# Patient Record
Sex: Female | Born: 1970 | Race: Black or African American | Hispanic: No | Smoking: Current every day smoker
Health system: Southern US, Community
[De-identification: ages and names within clinical notes are randomized; demographics above are authoritative.]

---

## 1997-12-13 ENCOUNTER — Other Ambulatory Visit: Admission: RE | Admit: 1997-12-13 | Discharge: 1997-12-13 | Payer: Self-pay | Admitting: Obstetrics & Gynecology

## 1998-05-10 ENCOUNTER — Inpatient Hospital Stay (HOSPITAL_COMMUNITY): Admission: AD | Admit: 1998-05-10 | Discharge: 1998-05-12 | Payer: Self-pay | Admitting: Obstetrics and Gynecology

## 2001-05-31 ENCOUNTER — Other Ambulatory Visit: Admission: RE | Admit: 2001-05-31 | Discharge: 2001-05-31 | Payer: Self-pay | Admitting: Internal Medicine

## 2003-07-25 ENCOUNTER — Emergency Department (HOSPITAL_COMMUNITY): Admission: EM | Admit: 2003-07-25 | Discharge: 2003-07-25 | Payer: Self-pay | Admitting: Family Medicine

## 2003-11-27 ENCOUNTER — Encounter: Admission: RE | Admit: 2003-11-27 | Discharge: 2003-11-27 | Payer: Self-pay | Admitting: Family Medicine

## 2003-12-28 ENCOUNTER — Encounter: Admission: RE | Admit: 2003-12-28 | Discharge: 2004-01-24 | Payer: Self-pay | Admitting: Orthopedic Surgery

## 2004-05-16 ENCOUNTER — Inpatient Hospital Stay (HOSPITAL_COMMUNITY): Admission: AD | Admit: 2004-05-16 | Discharge: 2004-05-16 | Payer: Self-pay | Admitting: Obstetrics and Gynecology

## 2004-05-19 ENCOUNTER — Inpatient Hospital Stay (HOSPITAL_COMMUNITY): Admission: AD | Admit: 2004-05-19 | Discharge: 2004-05-19 | Payer: Self-pay | Admitting: Obstetrics and Gynecology

## 2004-05-22 ENCOUNTER — Inpatient Hospital Stay (HOSPITAL_COMMUNITY): Admission: AD | Admit: 2004-05-22 | Discharge: 2004-05-22 | Payer: Self-pay | Admitting: Obstetrics and Gynecology

## 2004-06-03 ENCOUNTER — Inpatient Hospital Stay (HOSPITAL_COMMUNITY): Admission: AD | Admit: 2004-06-03 | Discharge: 2004-06-03 | Payer: Self-pay | Admitting: Obstetrics and Gynecology

## 2004-06-10 ENCOUNTER — Inpatient Hospital Stay (HOSPITAL_COMMUNITY): Admission: AD | Admit: 2004-06-10 | Discharge: 2004-06-10 | Payer: Self-pay | Admitting: Obstetrics and Gynecology

## 2004-06-18 ENCOUNTER — Inpatient Hospital Stay (HOSPITAL_COMMUNITY): Admission: AD | Admit: 2004-06-18 | Discharge: 2004-06-18 | Payer: Self-pay | Admitting: Obstetrics and Gynecology

## 2004-06-25 ENCOUNTER — Inpatient Hospital Stay (HOSPITAL_COMMUNITY): Admission: AD | Admit: 2004-06-25 | Discharge: 2004-06-25 | Payer: Self-pay | Admitting: Obstetrics and Gynecology

## 2004-07-09 ENCOUNTER — Inpatient Hospital Stay (HOSPITAL_COMMUNITY): Admission: AD | Admit: 2004-07-09 | Discharge: 2004-07-09 | Payer: Self-pay | Admitting: Obstetrics and Gynecology

## 2004-07-16 ENCOUNTER — Inpatient Hospital Stay (HOSPITAL_COMMUNITY): Admission: AD | Admit: 2004-07-16 | Discharge: 2004-07-16 | Payer: Self-pay | Admitting: Obstetrics and Gynecology

## 2004-07-24 ENCOUNTER — Inpatient Hospital Stay (HOSPITAL_COMMUNITY): Admission: AD | Admit: 2004-07-24 | Discharge: 2004-07-24 | Payer: Self-pay | Admitting: Obstetrics and Gynecology

## 2004-08-07 ENCOUNTER — Inpatient Hospital Stay (HOSPITAL_COMMUNITY): Admission: AD | Admit: 2004-08-07 | Discharge: 2004-08-07 | Payer: Self-pay | Admitting: Obstetrics and Gynecology

## 2010-04-20 ENCOUNTER — Encounter: Payer: Self-pay | Admitting: Family Medicine

## 2010-05-19 ENCOUNTER — Emergency Department (HOSPITAL_COMMUNITY)
Admission: EM | Admit: 2010-05-19 | Discharge: 2010-05-20 | Disposition: A | Discharge status: Home / Self Care | Payer: Medicaid Other | Attending: Emergency Medicine | Admitting: Emergency Medicine

## 2010-05-19 DIAGNOSIS — R51 Headache: Secondary | ICD-10-CM | POA: Insufficient documentation

## 2010-05-19 DIAGNOSIS — K2990 Gastroduodenitis, unspecified, without bleeding: Secondary | ICD-10-CM | POA: Insufficient documentation

## 2010-05-19 DIAGNOSIS — K2970 Gastritis, unspecified, without bleeding: Secondary | ICD-10-CM | POA: Insufficient documentation

## 2010-05-19 DIAGNOSIS — R1013 Epigastric pain: Secondary | ICD-10-CM | POA: Insufficient documentation

## 2010-05-20 LAB — CBC
HCT: 34.4 % — ABNORMAL LOW (ref 36.0–46.0)
MCH: 27.4 pg (ref 26.0–34.0)
MCV: 80.6 fL (ref 78.0–100.0)
RBC: 4.27 MIL/uL (ref 3.87–5.11)
WBC: 7.4 10*3/uL (ref 4.0–10.5)

## 2010-05-20 LAB — COMPREHENSIVE METABOLIC PANEL
CO2: 29 mEq/L (ref 19–32)
Calcium: 9.2 mg/dL (ref 8.4–10.5)
GFR calc Af Amer: >60 mL/min (ref >60)
GFR calc non Af Amer: 60 mL/min — ABNORMAL LOW (ref >60)
Glucose, Bld: 93 mg/dL (ref 70–99)
Sodium: 135 mEq/L (ref 135–145)
Total Bilirubin: 0.3 mg/dL (ref 0.3–1.2)

## 2010-05-20 LAB — POCT PREGNANCY, URINE: Preg Test, Ur: NEGATIVE

## 2010-05-20 LAB — DIFFERENTIAL
Basophils Relative: 1 % (ref 0–1)
Eosinophils Absolute: 0.2 10*3/uL (ref 0.0–0.7)
Eosinophils Relative: 3 % (ref 0–5)
Lymphs Abs: 2.5 10*3/uL (ref 0.7–4.0)

## 2010-09-21 ENCOUNTER — Emergency Department (HOSPITAL_COMMUNITY)
Admission: EM | Admit: 2010-09-21 | Discharge: 2010-09-22 | Disposition: A | Discharge status: Home / Self Care | Payer: Self-pay | Attending: Emergency Medicine | Admitting: Emergency Medicine

## 2010-09-21 DIAGNOSIS — R079 Chest pain, unspecified: Secondary | ICD-10-CM | POA: Insufficient documentation

## 2010-09-21 DIAGNOSIS — K2970 Gastritis, unspecified, without bleeding: Secondary | ICD-10-CM | POA: Insufficient documentation

## 2010-09-21 DIAGNOSIS — K219 Gastro-esophageal reflux disease without esophagitis: Secondary | ICD-10-CM | POA: Insufficient documentation

## 2010-09-21 DIAGNOSIS — R1013 Epigastric pain: Secondary | ICD-10-CM | POA: Insufficient documentation

## 2010-09-21 LAB — COMPREHENSIVE METABOLIC PANEL
ALT: 11 U/L (ref 0–35)
AST: 16 U/L (ref 0–37)
Alkaline Phosphatase: 49 U/L (ref 39–117)
Calcium: 10 mg/dL (ref 8.4–10.5)
Creatinine, Ser: 0.88 mg/dL (ref 0.50–1.10)
GFR calc non Af Amer: >60 mL/min (ref >60)
Sodium: 134 mEq/L — ABNORMAL LOW (ref 135–145)
Total Bilirubin: 0.3 mg/dL (ref 0.3–1.2)

## 2010-09-21 LAB — URINALYSIS, ROUTINE W REFLEX MICROSCOPIC
Bilirubin Urine: NEGATIVE
Glucose, UA: NEGATIVE mg/dL
Hgb urine dipstick: NEGATIVE
Ketones, ur: NEGATIVE mg/dL
Nitrite: NEGATIVE
Specific Gravity, Urine: 1.006 (ref 1.005–1.030)
pH: 6.5 (ref 5.0–8.0)

## 2010-09-21 LAB — DIFFERENTIAL
Basophils Absolute: 0 10*3/uL (ref 0.0–0.1)
Basophils Relative: 0 % (ref 0–1)
Eosinophils Relative: 3 % (ref 0–5)
Lymphocytes Relative: 36 % (ref 12–46)
Monocytes Absolute: 0.7 10*3/uL (ref 0.1–1.0)
Neutro Abs: 3.5 10*3/uL (ref 1.7–7.7)

## 2010-09-21 LAB — CBC: Hemoglobin: 10.6 g/dL — ABNORMAL LOW (ref 12.0–15.0)

## 2013-05-31 ENCOUNTER — Emergency Department (INDEPENDENT_AMBULATORY_CARE_PROVIDER_SITE_OTHER)
Admission: EM | Admit: 2013-05-31 | Discharge: 2013-05-31 | Disposition: A | Discharge status: Home / Self Care | Payer: Self-pay | Source: Home / Self Care | Attending: Family Medicine | Admitting: Family Medicine

## 2013-05-31 ENCOUNTER — Encounter (HOSPITAL_COMMUNITY): Payer: Self-pay | Admitting: Emergency Medicine

## 2013-05-31 DIAGNOSIS — M7712 Lateral epicondylitis, left elbow: Secondary | ICD-10-CM

## 2013-05-31 DIAGNOSIS — M7710 Lateral epicondylitis, unspecified elbow: Secondary | ICD-10-CM

## 2013-05-31 MED ORDER — NAPROXEN 500 MG PO TABS: 500.0000 mg | ORAL_TABLET | Freq: Two times a day (BID) | ORAL | Status: DC

## 2013-05-31 NOTE — Discharge Instructions (Signed)
Tennis Elbow  Your caregiver has diagnosed you with a condition often referred to as "tennis elbow." This results from small tears or soreness (inflammation) at the start (origin) of the extensor muscles of the forearm. Although the condition is often called tennis or golfer's elbow, it is caused by any repetitive action performed by your elbow.  HOME CARE INSTRUCTIONS   If the condition has been short lived, rest may be the only treatment required. Using your opposite hand or arm to perform the task may help. Even changing your grip may help rest the extremity. These may even prevent the condition from recurring.   Longer standing problems, however, will often be relieved faster by:   Using anti-inflammatory agents.   Applying ice packs for 30 minutes at the end of the working day, at bed time, or when activities are finished.   Your caregiver may also have you wear a splint or sling. This will allow the inflamed tendon to heal.  At times, steroid injections aided with a local anesthetic will be required along with splinting for 1 to 2 weeks. Two to three steroid injections will often solve the problem. In some long standing cases, the inflamed tendon does not respond to conservative (non-surgical) therapy. Then surgery may be required to repair it.  MAKE SURE YOU:    Understand these instructions.   Will watch your condition.   Will get help right away if you are not doing well or get worse.  Document Released: 03/16/2005 Document Revised: 06/08/2011 Document Reviewed: 11/02/2007  Exit[employer] Patient Information 2014 [employer].

## 2013-05-31 NOTE — ED Notes (Signed)
Pt  reports  Pain l  Arm      denys  Any  specefic  Injury         Symptoms   X  5  Days        Pt  Reports   Symptoms  Not  releived  By  otc  meds  -  ibuprophen

## 2013-05-31 NOTE — ED Provider Notes (Signed)
CSN: [id number]     Arrival date & time 05/31/13  1223 History   First MD Initiated Contact with Patient 05/31/13 1403     Chief Complaint  Patient presents with  . Extremity Pain   (Consider location/radiation/quality/duration/timing/severity/associated sxs/prior Treatment) HPI Comments: Patient presents with 4-5 days of left elbow discomfort without recent or remote injury. Denies previous events. Has used a few doses of ibuprofen with limited relief. 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 No fever or joint swelling. No changes in strength or sensation of LUE. States area is most uncomfortable with ROM at elbow and if she needs to pick up or lift an object with her left hand.   The history is provided by the patient.    History reviewed. No pertinent past medical history. History reviewed. No pertinent past surgical history. History reviewed. No pertinent family history. History  Substance Use Topics  . Smoking status: Current Every Day Smoker  . Smokeless tobacco: Not on file  . Alcohol Use: Yes   OB History   Grav Para Term Preterm Abortions TAB SAB Ect Mult Living                 Review of Systems  All other systems reviewed and are negative.    Allergies  Review of patient's allergies indicates no known allergies.  Home Medications   Current Outpatient Rx  Name  Route  Sig  Dispense  Refill  . Ibuprofen (MOTRIN PO)   Oral   Take by mouth.         . naproxen (NAPROSYN) 500 MG tablet   Oral   Take 1 tablet (500 mg total) by mouth 2 (two) times daily. For 7 days   20 tablet   0    BP 144/91  Pulse 86  Temp(Src) 99.2 F (37.3 C) (Oral)  Resp 16  SpO2 100%  LMP 05/18/2013 Physical Exam  Nursing note and vitals reviewed. Constitutional: She is oriented to person, place, and time. She appears well-developed and well-nourished. No distress.  HENT:  Head: Normocephalic and atraumatic.  Cardiovascular: Normal rate, regular rhythm and normal heart sounds.    Pulmonary/Chest: Effort normal and breath sounds normal.  Musculoskeletal: Normal range of motion.       Left elbow: She exhibits normal range of motion, no swelling, no effusion, no deformity and no laceration. Tenderness found. Lateral epicondyle tenderness noted. No radial head, no medial epicondyle and no olecranon process tenderness noted.  CSM exam of LUE intact.   Neurological: She is alert and oriented to person, place, and time.  Skin: Skin is warm and dry. No rash noted.  Psychiatric: She has a normal mood and affect. Her behavior is normal.    ED Course  Procedures (including critical care time) Labs Review Labs Reviewed - No data to display Imaging Review No results found.   MDM   1. Lateral epicondylitis of left elbow    Lateral epicondylitis (Left): Patient provided stretching exercises by Dr. Denyse Amassorey and advised use of ice as needed and NSAIDs as prescribed with [facility] follow up if no improvement.    Jess BartersJennifer Lee EdgewoodPresson, GeorgiaPA 05/31/13 [phone]

## 2013-06-01 NOTE — ED Provider Notes (Signed)
Medical screening examination/treatment/procedure(s) were performed by a resident physician or non-physician practitioner and as the supervising physician I was immediately available for consultation/collaboration.  Clementeen GrahamEvan Aminta Sakurai, MD    Rodolph BongEvan S Adrie Picking, MD 06/01/13 [phone]

## 2015-06-26 ENCOUNTER — Other Ambulatory Visit: Payer: Self-pay | Admitting: Obstetrics and Gynecology

## 2015-06-26 DIAGNOSIS — E049 Nontoxic goiter, unspecified: Secondary | ICD-10-CM

## 2015-07-02 ENCOUNTER — Ambulatory Visit
Admission: RE | Admit: 2015-07-02 | Discharge: 2015-07-02 | Disposition: A | Discharge status: Home / Self Care | Payer: Commercial Managed Care - PPO | Source: Ambulatory Visit | Attending: Obstetrics and Gynecology | Admitting: Obstetrics and Gynecology

## 2015-07-02 DIAGNOSIS — E049 Nontoxic goiter, unspecified: Secondary | ICD-10-CM

## 2015-07-03 ENCOUNTER — Other Ambulatory Visit: Payer: Self-pay | Admitting: Obstetrics and Gynecology

## 2015-07-03 DIAGNOSIS — N63 Unspecified lump in unspecified breast: Secondary | ICD-10-CM

## 2015-07-10 ENCOUNTER — Other Ambulatory Visit: Payer: Commercial Managed Care - PPO

## 2015-07-16 ENCOUNTER — Ambulatory Visit
Admission: RE | Admit: 2015-07-16 | Discharge: 2015-07-16 | Disposition: A | Discharge status: Home / Self Care | Payer: Commercial Managed Care - PPO | Source: Ambulatory Visit | Attending: Obstetrics and Gynecology | Admitting: Obstetrics and Gynecology

## 2015-07-16 DIAGNOSIS — N63 Unspecified lump in unspecified breast: Secondary | ICD-10-CM

## 2015-10-29 ENCOUNTER — Other Ambulatory Visit: Payer: Self-pay | Admitting: Endocrinology

## 2015-10-29 DIAGNOSIS — E049 Nontoxic goiter, unspecified: Secondary | ICD-10-CM

## 2016-01-02 ENCOUNTER — Encounter (HOSPITAL_COMMUNITY): Payer: Self-pay | Admitting: Emergency Medicine

## 2016-01-02 ENCOUNTER — Ambulatory Visit (HOSPITAL_COMMUNITY)
Admission: EM | Admit: 2016-01-02 | Discharge: 2016-01-02 | Disposition: A | Discharge status: Home / Self Care | Payer: Commercial Managed Care - PPO | Attending: Internal Medicine | Admitting: Internal Medicine

## 2016-01-02 DIAGNOSIS — H0015 Chalazion left lower eyelid: Secondary | ICD-10-CM

## 2016-01-02 MED ORDER — DOXYCYCLINE HYCLATE 100 MG PO CAPS: 100.0000 mg | ORAL_CAPSULE | Freq: Two times a day (BID) | ORAL | 0 refills | Status: DC

## 2016-01-02 NOTE — ED Provider Notes (Signed)
  [facility]    CSN: [id number] Arrival date & time: 01/02/16  1642     History   Chief Complaint Chief Complaint  Patient presents with  . Stye    HPI Vanessa Wade is a 45 y.o. female. She presents this evening with 2 weeks history of smooth swelling of the left lower lid, nonpainful. Not draining. Has been applying warm compresses without resolution. No change in vision, no eye drainage. Feels fine otherwise  HPI  History reviewed. No pertinent past medical history.  History reviewed. No pertinent surgical history.    Home Medications    Prior to Admission medications   Medication Sig Start Date End Date Taking? Authorizing Provider  doxycycline (VIBRAMYCIN) 100 MG capsule Take 1 capsule (100 mg total) by mouth 2 (two) times daily. 01/02/16   Eustace MooreLaura W Laquanta Hummel, MD  Ibuprofen (MOTRIN PO) Take by mouth.    Historical Provider, MD  naproxen (NAPROSYN) 500 MG tablet Take 1 tablet (500 mg total) by mouth 2 (two) times daily. For 7 days 05/31/13   Ria ClockJennifer Lee H Presson, PA    Family History History reviewed. No pertinent family history.  Social History Social History  Substance Use Topics  . Smoking status: Current Every Day Smoker  . Smokeless tobacco: Never Used  . Alcohol use Yes     Allergies   Review of patient's allergies indicates no known allergies.   Review of Systems Review of Systems  All other systems reviewed and are negative.    Physical Exam Triage Vital Signs ED Triage Vitals [01/02/16 1751]  Enc Vitals Group     BP 138/77     Pulse Rate 97     Resp 12     Temp 98.8 F (37.1 C)     Temp Source Oral     SpO2 100 %     Weight      Height    Updated Vital Signs BP 138/77 (BP Location: Left Arm)   Pulse 97   Temp 98.8 F (37.1 C) (Oral)   Resp 12   SpO2 100%  Physical Exam  Constitutional: She is oriented to person, place, and time. No distress.  Alert, nicely groomed  HENT:  Head: Atraumatic.  Eyes:  Conjugate gaze,  no eye redness/drainage 6 mm smooth yellowish nontender cyst just below the lashes in the left lower eyelid. Not pointing. Not red.  Neck: Neck supple.  Cardiovascular: Normal rate.   Pulmonary/Chest: No respiratory distress.  Abdominal: She exhibits no distension.  Musculoskeletal: Normal range of motion.  No leg swelling  Neurological: She is alert and oriented to person, place, and time.  Skin: Skin is warm and dry.  No cyanosis  Nursing note and vitals reviewed.    UC Treatments / Results   Procedures Procedures (including critical care time) None today  Final Clinical Impressions(s) / UC Diagnoses   Final diagnoses:  Chalazion left lower eyelid   Warm compresses; can try doxycycline prescription.  Followup with eye doctor for further evaluation if symptoms persist.    New Prescriptions New Prescriptions   DOXYCYCLINE (VIBRAMYCIN) 100 MG CAPSULE    Take 1 capsule (100 mg total) by mouth 2 (two) times daily.     Eustace MooreLaura W Barbarajean Kinzler, MD 01/02/16 [phone]

## 2016-01-02 NOTE — Discharge Instructions (Addendum)
Warm compresses; can try doxycycline prescription.  Followup with eye doctor for further evaluation if symptoms persist.

## 2016-01-02 NOTE — ED Triage Notes (Signed)
Pt here for sty to lower left eye onset 2 weeks... Denies pain  Has been applying warm compression w/no relief  A&O x4... NAD

## 2016-01-02 NOTE — ED Notes (Signed)
D/c by Dr. Murray 

## 2019-08-16 ENCOUNTER — Encounter (INDEPENDENT_AMBULATORY_CARE_PROVIDER_SITE_OTHER): Payer: Self-pay | Admitting: Primary Care

## 2019-08-16 ENCOUNTER — Other Ambulatory Visit: Payer: Self-pay

## 2019-08-16 ENCOUNTER — Ambulatory Visit (INDEPENDENT_AMBULATORY_CARE_PROVIDER_SITE_OTHER): Payer: PRIVATE HEALTH INSURANCE | Admitting: Primary Care

## 2019-08-16 VITALS — BP 137/85 | HR 81 | Temp 97.5°F | Ht 64.0 in | Wt 187.4 lb

## 2019-08-16 DIAGNOSIS — Z7689 Persons encountering health services in other specified circumstances: Secondary | ICD-10-CM

## 2019-08-16 DIAGNOSIS — Z13220 Encounter for screening for lipoid disorders: Secondary | ICD-10-CM

## 2019-08-16 DIAGNOSIS — Z131 Encounter for screening for diabetes mellitus: Secondary | ICD-10-CM | POA: Diagnosis not present

## 2019-08-16 DIAGNOSIS — G4733 Obstructive sleep apnea (adult) (pediatric): Secondary | ICD-10-CM | POA: Diagnosis not present

## 2019-08-16 DIAGNOSIS — R030 Elevated blood-pressure reading, without diagnosis of hypertension: Secondary | ICD-10-CM | POA: Diagnosis not present

## 2019-08-16 DIAGNOSIS — Z6832 Body mass index (BMI) 32.0-32.9, adult: Secondary | ICD-10-CM

## 2019-08-16 DIAGNOSIS — E6609 Other obesity due to excess calories: Secondary | ICD-10-CM

## 2019-08-16 LAB — POCT GLYCOSYLATED HEMOGLOBIN (HGB A1C): Hemoglobin A1C: 5.1 % (ref 4.0–5.6)

## 2019-08-16 NOTE — Progress Notes (Signed)
.men   New Patient Office Visit  Subjective:  Patient ID: Vanessa Wade, female    DOB: 11-15-1970  Age: 49 y.o. MRN: [id number]  CC:  Chief Complaint  Patient presents with  . New Patient (Initial Visit)    HPI Vanessa Wade is a 49 year old [place] American female who presents for establishment of care. She is concerned about elevated blood pressure reading despite diet modification monitoring her salt intake and exercising. Denies shortness of breath, headaches, chest pain or lower extremity edema, sudden onset, vision changes, unilateral weakness, dizziness, paresthesias. Today her Bp is slightly elevated but only have this 1 reading.   History reviewed. No pertinent past medical history.  History reviewed. No pertinent surgical history.  History reviewed. No pertinent family history.  Social History   Socioeconomic History  . Marital status: Divorced    Spouse name: Not on file  . Number of children: Not on file  . Years of education: Not on file  . Highest education level: Not on file  Occupational History  . Not on file  Tobacco Use  . Smoking status: Current Every Day Smoker  . Smokeless tobacco: Never Used  Substance and Sexual Activity  . Alcohol use: Yes  . Drug use: Not on file  . Sexual activity: Not on file  Other Topics Concern  . Not on file  Social History Narrative  . Not on file   Social Determinants of Health   Financial Resource Strain:   . Difficulty of Paying Living Expenses:   Food Insecurity:   . Worried About Charity fundraiser in the Last Year:   . Arboriculturist in the Last Year:   Transportation Needs:   . Film/video editor (Medical):   Marland Kitchen Lack of Transportation (Non-Medical):   Physical Activity:   . Days of Exercise per Week:   . Minutes of Exercise per Session:   Stress:   . Feeling of Stress :   Social Connections:   . Frequency of Communication with Friends and Family:   . Frequency of Social Gatherings with  Friends and Family:   . Attends Religious Services:   . Active Member of Clubs or Organizations:   . Attends Archivist Meetings:   Marland Kitchen Marital Status:   Intimate Partner Violence:   . Fear of Current or Ex-Partner:   . Emotionally Abused:   Marland Kitchen Physically Abused:   . Sexually Abused:     ROS Review of Systems  All other systems reviewed and are negative.   Objective:   Today's Vitals: BP 137/85 (BP Location: Right Arm, Patient Position: Sitting, Cuff Size: Large)   Pulse 81   Temp (!) 97.5 F (36.4 C) (Temporal)   Ht 5\' 4"  (1.626 m)   Wt 187 lb 6.4 oz (85 kg)   LMP 08/12/2019 (Exact Date)   SpO2 100%   BMI 32.17 kg/m   Physical Exam CONSTITUTIONAL: Well-developed, well-nourished obese female in no acute distress.  HENT:  Normocephalic, atraumatic, External right and left ear normal. Oropharynx is clear and moist EYES: Conjunctivae and EOM are normal. Pupils are equal, round, and reactive to light. No scleral icterus.  NECK: Normal range of motion, supple, no masses.  Normal thyroid.  SKIN: Skin is warm and dry. No rash noted. Not diaphoretic. No erythema. No pallor. [place]: Alert and oriented to person, place, and time. Normal reflexes, muscle tone coordination. No cranial nerve deficit noted. PSYCHIATRIC: Normal mood and affect.  Normal behavior. Normal judgment and thought content. CARDIOVASCULAR: Normal heart rate noted, regular rhythm RESPIRATORY: Clear to auscultation bilaterally. Effort and breath sounds normal, no problems with respiration noted. ABDOMEN: Soft, normal bowel sounds, no distention noted.  No tenderness, rebound or guarding.  MUSCULOSKELETAL: Normal range of motion. No tenderness.  No cyanosis, clubbing, or edema.  2+ distal pulses. Assessment & Plan:  Lalanya was seen today for new patient (initial visit).  Diagnoses and all orders for this visit:  Encounter to establish care Gwinda Passe, NP-C will be your  (PCP) she is mastered prepared  . She is skilled to diagnosed and treat illness. Also able to answer health concern as well as continuing care of varied medical conditions, not limited by cause, organ system, or diagnosis.   Screening for diabetes mellitus -     HgB A1c 5.1  OSA (obstructive sleep apnea) Patient presents with possible obstructive sleep apnea. Patent has a 3 years history of symptoms of none. Patient generally gets 6 or 7 hours of sleep per night, and states they generally have nightime awakenings. Snoring of moderate severity is present. Apneic episodes is not present. Nasal obstruction is not present.  Patient has had tonsillectomy.   Elevated blood pressure reading without diagnosis of hypertension Counseled on blood pressure goal of less than 130/80, low-sodium, DASH diet, medication compliance, 150 minutes of moderate intensity exercise per week.  Class 1 obesity due to excess calories without serious comorbidity with body mass index (BMI) of 32.0 to 32.9 in adult Obesity is 30-39 indicating an excess in caloric intake or underlining conditions. This may lead to other co-morbidities examples , HTN, diabetes and respiratory disease. Lifestyle modifications of diet and exercise may reduce obesity.     Outpatient Encounter Medications as of 08/16/2019  Medication Sig  . [DISCONTINUED] doxycycline (VIBRAMYCIN) 100 MG capsule Take 1 capsule (100 mg total) by mouth 2 (two) times daily.  . [DISCONTINUED] Ibuprofen (MOTRIN PO) Take by mouth.   No facility-administered encounter medications on file as of 08/16/2019.    Follow-up: Return for schedule pap.   Grayce Sessions, NP

## 2019-08-17 LAB — LIPID PANEL
Chol/HDL Ratio: 2.8 ratio (ref 0.0–4.4)
Cholesterol, Total: 196 mg/dL (ref 100–199)
HDL: 69 mg/dL (ref >39)
LDL Chol Calc (NIH): 114 mg/dL — ABNORMAL HIGH (ref 0–99)
Triglycerides: 71 mg/dL (ref 0–149)
VLDL Cholesterol Cal: 13 mg/dL (ref 5–40)

## 2019-08-17 LAB — TSH+FREE T4
Free T4: 1.17 ng/dL (ref 0.82–1.77)
TSH: 0.632 u[IU]/mL (ref 0.450–4.500)

## 2019-08-22 ENCOUNTER — Telehealth (INDEPENDENT_AMBULATORY_CARE_PROVIDER_SITE_OTHER): Payer: Self-pay

## 2019-08-22 NOTE — Telephone Encounter (Signed)
Patient verified date of birth. She is aware that labs are normal except elevated LDL. Which can lead to stroke and heart attack. Advised patient on decreasing fatty food,red meat, cheese and milk; increase fiber with whole grains and veggies. If diet modifications do not lower cholesterol medication will be added at recheck. She verbalized understanding. Maryjean Morn, CMA

## 2019-08-22 NOTE — Telephone Encounter (Signed)
-----   Message from Grayce Sessions, NP sent at 08/21/2019  1:55 PM EDT ----- Labs are normal except LDL elevated. Your LDL is not in range. Your LDL is the bad cholesterol that can lead to heart attack and stroke. To lower your number you can decrease your fatty foods, red meat, cheese, milk and increase fiber like whole grains and veggies. You can also add a fiber supplement like Metamucil or Benefiber. If this is not done with diet modification and fiber supplements follow up labs will need to add a cholesterol medication

## 2019-08-30 ENCOUNTER — Other Ambulatory Visit (HOSPITAL_COMMUNITY)
Admission: RE | Admit: 2019-08-30 | Discharge: 2019-08-30 | Disposition: A | Discharge status: Home / Self Care | Payer: PRIVATE HEALTH INSURANCE | Source: Ambulatory Visit | Attending: Primary Care | Admitting: Primary Care

## 2019-08-30 ENCOUNTER — Other Ambulatory Visit: Payer: Self-pay

## 2019-08-30 ENCOUNTER — Ambulatory Visit (INDEPENDENT_AMBULATORY_CARE_PROVIDER_SITE_OTHER): Payer: PRIVATE HEALTH INSURANCE | Admitting: Primary Care

## 2019-08-30 ENCOUNTER — Encounter (INDEPENDENT_AMBULATORY_CARE_PROVIDER_SITE_OTHER): Payer: Self-pay | Admitting: Primary Care

## 2019-08-30 VITALS — BP 113/76 | HR 89 | Temp 98.6°F | Ht 64.0 in | Wt 184.8 lb

## 2019-08-30 DIAGNOSIS — N898 Other specified noninflammatory disorders of vagina: Secondary | ICD-10-CM | POA: Insufficient documentation

## 2019-08-30 DIAGNOSIS — Z124 Encounter for screening for malignant neoplasm of cervix: Secondary | ICD-10-CM

## 2019-08-30 DIAGNOSIS — Z1231 Encounter for screening mammogram for malignant neoplasm of breast: Secondary | ICD-10-CM | POA: Diagnosis not present

## 2019-08-30 DIAGNOSIS — Z01419 Encounter for gynecological examination (general) (routine) without abnormal findings: Secondary | ICD-10-CM

## 2019-08-30 DIAGNOSIS — Z114 Encounter for screening for human immunodeficiency virus [HIV]: Secondary | ICD-10-CM

## 2019-08-30 NOTE — Patient Instructions (Signed)

## 2019-08-30 NOTE — Progress Notes (Signed)
Subjective:     Vanessa Wade is a 49 y.o. female and is here for a comprehensive physical exam. The patient reports no problems.  Social History   Socioeconomic History  . Marital status: Divorced    Spouse name: Not on file  . Number of children: Not on file  . Years of education: Not on file  . Highest education level: Not on file  Occupational History  . Not on file  Tobacco Use  . Smoking status: Current Every Day Smoker  . Smokeless tobacco: Never Used  Substance and Sexual Activity  . Alcohol use: Yes  . Drug use: Not on file  . Sexual activity: Not on file  Other Topics Concern  . Not on file  Social History Narrative  . Not on file   Social Determinants of Health   Financial Resource Strain:   . Difficulty of Paying Living Expenses:   Food Insecurity:   . Worried About Charity fundraiser in the Last Year:   . Arboriculturist in the Last Year:   Transportation Needs:   . Film/video editor (Medical):   Marland Kitchen Lack of Transportation (Non-Medical):   Physical Activity:   . Days of Exercise per Week:   . Minutes of Exercise per Session:   Stress:   . Feeling of Stress :   Social Connections:   . Frequency of Communication with Friends and Family:   . Frequency of Social Gatherings with Friends and Family:   . Attends Religious Services:   . Active Member of Clubs or Organizations:   . Attends Archivist Meetings:   Marland Kitchen Marital Status:   Intimate Partner Violence:   . Fear of Current or Ex-Partner:   . Emotionally Abused:   Marland Kitchen Physically Abused:   . Sexually Abused:    Health Maintenance  Topic Date Due  . COVID-19 Vaccine (1) Never done  . HIV Screening  Never done  . PAP SMEAR-Modifier  Never done  . TETANUS/TDAP  11/12/2019 (Originally 02/09/1991)  . INFLUENZA VACCINE  10/29/2019      Review of Systems A comprehensive review of systems was negative.   Objective:     BP 113/76 (BP Location: Right Arm, Patient Position: Sitting,  Cuff Size: Large)   Pulse 89   Temp 98.6 F (37 C) (Tympanic)   Ht 5\' 4"  (1.626 m)   Wt 184 lb 12.8 oz (83.8 kg)   LMP 08/12/2019 (Exact Date)   SpO2 97%   BMI 31.72 kg/m   Assessment:    Healthy female exam. CONSTITUTIONAL: Well-developed, well-nourished female in no acute distress.  HENT:  Normocephalic, atraumatic, External right and left ear normal. Oropharynx is clear and moist EYES: Conjunctivae and EOM are normal. Pupils are equal, round, and reactive to light. No scleral icterus.  NECK: Normal range of motion, supple, no masses.  Normal thyroid.  SKIN: Skin is warm and dry. No rash noted. Not diaphoretic. No erythema. No pallor. [place]: Alert and oriented to person, place, and time. Normal reflexes, muscle tone coordination. No cranial nerve deficit noted. PSYCHIATRIC: Normal mood and affect. Normal behavior. Normal judgment and thought content. CARDIOVASCULAR: Normal heart rate noted, regular rhythm RESPIRATORY: Clear to auscultation bilaterally. Effort and breath sounds normal, no problems with respiration noted. BREASTS:Taught SBE and refer for mammogram . ABDOMEN: Soft, normal bowel sounds, no distention noted.  No tenderness, rebound or guarding.  PELVIC: Normal appearing external genitalia; normal appearing vaginal mucosa and cervix.  No  abnormal discharge noted.  Pap smear obtained.  Normal uterine size, no other palpable masses, no uterine or adnexal tenderness. MUSCULOSKELETAL: Normal range of motion. No tenderness.  No cyanosis, clubbing, or edema.  2+ distal pulses.  Plan:  Jace was seen today for gynecologic exam.  Diagnoses and all orders for this visit:  Encounter for gynecological examination without abnormal finding Normal   Encounter for screening mammogram for malignant neoplasm of breast -     MM Digital Diagnostic Bilat; Future  Cervical cancer screening -     Cytology - PAP([facility])  Vaginal discharge -     Cervicovaginal ancillary  only  Screening for HIV (human immunodeficiency virus) -     HIV antibody (with reflex)     See After Visit Summary for Counseling Recommendations

## 2019-08-31 ENCOUNTER — Other Ambulatory Visit (INDEPENDENT_AMBULATORY_CARE_PROVIDER_SITE_OTHER): Payer: Self-pay | Admitting: Primary Care

## 2019-08-31 DIAGNOSIS — B379 Candidiasis, unspecified: Secondary | ICD-10-CM

## 2019-08-31 LAB — HIV ANTIBODY (ROUTINE TESTING W REFLEX): HIV Screen 4th Generation wRfx: NONREACTIVE

## 2019-08-31 LAB — CERVICOVAGINAL ANCILLARY ONLY
Bacterial Vaginitis (gardnerella): NEGATIVE
Candida Glabrata: NEGATIVE
Candida Vaginitis: POSITIVE — AB
Chlamydia: NEGATIVE
Comment: NEGATIVE
Comment: NEGATIVE
Comment: NEGATIVE
Comment: NEGATIVE
Comment: NEGATIVE
Comment: NORMAL
Neisseria Gonorrhea: NEGATIVE
Trichomonas: NEGATIVE

## 2019-08-31 LAB — CYTOLOGY - PAP: Diagnosis: NEGATIVE

## 2019-08-31 MED ORDER — FLUCONAZOLE 150 MG PO TABS: 150.0000 mg | ORAL_TABLET | Freq: Once | ORAL | 0 refills | Status: AC

## 2019-09-01 ENCOUNTER — Other Ambulatory Visit (INDEPENDENT_AMBULATORY_CARE_PROVIDER_SITE_OTHER): Payer: Self-pay | Admitting: Primary Care

## 2019-09-01 MED ORDER — FLUCONAZOLE 150 MG PO TABS
150.0000 mg | ORAL_TABLET | Freq: Once | ORAL | 0 refills | Status: AC
Start: 2019-09-01 — End: 2019-09-01

## 2019-09-13 ENCOUNTER — Other Ambulatory Visit: Payer: Self-pay

## 2019-09-13 ENCOUNTER — Ambulatory Visit
Admission: RE | Admit: 2019-09-13 | Discharge: 2019-09-13 | Disposition: A | Discharge status: Home / Self Care | Payer: PRIVATE HEALTH INSURANCE | Source: Ambulatory Visit | Attending: Primary Care | Admitting: Primary Care

## 2019-09-13 DIAGNOSIS — Z1231 Encounter for screening mammogram for malignant neoplasm of breast: Secondary | ICD-10-CM

## 2020-10-29 ENCOUNTER — Encounter (INDEPENDENT_AMBULATORY_CARE_PROVIDER_SITE_OTHER): Payer: PRIVATE HEALTH INSURANCE | Admitting: Primary Care

## 2020-11-19 ENCOUNTER — Other Ambulatory Visit: Payer: Self-pay

## 2020-11-19 ENCOUNTER — Ambulatory Visit (INDEPENDENT_AMBULATORY_CARE_PROVIDER_SITE_OTHER): Payer: PRIVATE HEALTH INSURANCE | Admitting: Primary Care

## 2020-11-19 ENCOUNTER — Encounter (INDEPENDENT_AMBULATORY_CARE_PROVIDER_SITE_OTHER): Payer: Self-pay | Admitting: Primary Care

## 2020-11-19 VITALS — BP 129/83 | HR 87 | Temp 97.5°F | Ht 64.0 in | Wt 187.2 lb

## 2020-11-19 DIAGNOSIS — N926 Irregular menstruation, unspecified: Secondary | ICD-10-CM | POA: Diagnosis not present

## 2020-11-19 DIAGNOSIS — E049 Nontoxic goiter, unspecified: Secondary | ICD-10-CM

## 2020-11-19 DIAGNOSIS — F17290 Nicotine dependence, other tobacco product, uncomplicated: Secondary | ICD-10-CM | POA: Diagnosis not present

## 2020-11-19 DIAGNOSIS — E6609 Other obesity due to excess calories: Secondary | ICD-10-CM | POA: Diagnosis not present

## 2020-11-19 DIAGNOSIS — Z131 Encounter for screening for diabetes mellitus: Secondary | ICD-10-CM

## 2020-11-19 DIAGNOSIS — Z0000 Encounter for general adult medical examination without abnormal findings: Secondary | ICD-10-CM

## 2020-11-19 DIAGNOSIS — Z6832 Body mass index (BMI) 32.0-32.9, adult: Secondary | ICD-10-CM

## 2020-11-19 DIAGNOSIS — Z23 Encounter for immunization: Secondary | ICD-10-CM

## 2020-11-19 DIAGNOSIS — Z1211 Encounter for screening for malignant neoplasm of colon: Secondary | ICD-10-CM

## 2020-11-19 LAB — POCT GLYCOSYLATED HEMOGLOBIN (HGB A1C): Hemoglobin A1C: 5.1 % (ref 4.0–5.6)

## 2020-11-19 NOTE — Progress Notes (Signed)
[facility]  Patient ID: Vanessa Wade, female    DOB: 04-17-1970  MRN: [id number]   Annual Wellness Visit  Vanessa Wade is a 50 y.o. Female who presents for an Annual Wellness Visit. ? There are no problems to display for this patient.   No current outpatient medications on file prior to visit.   No current facility-administered medications on file prior to visit.    No Known Allergies   Health Risk Assessment The patient has completed a Health Risk Assessment. This has been reviewed with the patient and has been scanned into the [facility] system as a separate document.   Current Medical Providers and Suppliers The providers who are involved in the care of this patient are listed above. Additional providers and suppliers are listed below:    Age-appropriate Screening Schedule Refer to the list in the Health Maintenance section for an age appropriated screening completed by this patient. Additional screening recommendations are listed below in the plan section. The patient has been provided with a written plan.    Health Maintenance Due  Topic Date Due   Pneumococcal Vaccine 36-68 Years old (1 - PCV) Never done   Hepatitis C Screening  Never done   COLONOSCOPY (Pts 45-32yrs Insurance coverage will need to be confirmed)  Never done   INFLUENZA VACCINE  10/28/2020    Depression Screen Over the past two weeks have you:     Felt down or depressed? no     Had little interest or pleasure in doing things? no     depression             Hearing Evaluation:     Do you have trouble hearing the television when others do not? no     Do you have to strain to hear/understand conversations? no  Advanced Care Planning     Patient has executed an Advance Directive: no     If no, patient was given the opportunity to execute an Advance Directive today? yes     This patient has the ability to prepare an Advance Directive: no     Provider is willing to follow the patient's  wishes: none made      Cognitive Assessment: Does the patient have evidence of cognitive impairment? No The patient does not have evidence of a change in mood/affect, appearance, speech, memory or motor skills.   I  ? PHYSICAL EXAM: Vitals:   11/19/20 1034  BP: 129/83  Pulse: 87  Temp: (!) 97.5 F (36.4 C)  TempSrc: Temporal  SpO2: 100%  Weight: 187 lb 3.2 oz (84.9 kg)  Height: $Remove'5\' 4"'cMvdkOX$  (1.626 m)   Body mass index is 32.13 kg/m. General appearance - alert, well appearing, and in no distress Mental status - alert, oriented to person, place, and time Eyes - pupils equal and reactive, extraocular eye movements intact Ears - bilateral cerumen  Nose - normal and patent, no erythema, discharge or polyps Neck - supple, goiter  Lymphatics - no palpable lymphadenopathy, no hepatosplenomegaly Chest - clear to auscultation, no wheezes, rales or rhonchi, symmetric air entry Heart - normal rate, regular rhythm, normal S1, S2, no murmurs, rubs, clicks or gallops Abdomen - soft, nontender, nondistended, no masses or organomegaly Back exam - full range of motion, no tenderness, palpable spasm or pain on motion Neurological - alert, oriented, normal speech, no focal findings or movement disorder noted  During the course of the visit the patient was educated and counseled about appropriate screening and  preventive services including:            Influenza vaccine Td vaccine Colorectal cancer screening Nutrition counseling  Discussed as f/u and health maintenance         Discussed the patient's BMI with her. The BMI BMI is not in the acceptable range; no BMI management plan is appropriate.   Orders placed during this encounter include: Orders Placed This Encounter  Procedures   Tdap vaccine greater than or equal to 7yo IM   CBC with Differential   Lipid Panel   CMP14+EGFR   TSH + free T4   Ambulatory referral to Gastroenterology    Referral Priority:   Routine    Referral Type:    Consultation    Referral Reason:   Specialty Services Required    Number of Visits Requested:   1   HgB A1c  Vanessa Wade was seen today for annual exam.  Diagnoses and all orders for this visit:  Screening for diabetes mellitus -     HgB A1c 5.1 per ADA guideline she is not a diabetic   Need for Tdap vaccination -     Tdap vaccine greater than or equal to 7yo IM  Class 1 obesity due to excess calories without serious comorbidity with body mass index (BMI) of 32.0 to 32.9 in adult Obesity is 30-39 indicating an excess in caloric intake or underlining conditions. This may lead to other co-morbidities. Lifestyle modifications of diet and exercise may reduce obesity.    Annual physical exam Completed   Menstrual cycle problem CBC with diff  Cigar smoker motivated to quit  - I have recommended complete cessation of tobacco use. I have discussed various options available for assistance with tobacco cessation including over the counter methods (Nicotine gum, patch and lozenges). We also discussed prescription options (Chantix, Nicotine Inhaler / Nasal Spray). The patient is not interested in pursuing any prescription tobacco cessation options at this time. - Patient declines at this time.  -   Return in about 1 year (around 11/19/2021), or if symptoms worsen or fail to improve/ depends on labs.   ? An after visit summary with all of these plans was given to the patient. Kerin Perna

## 2020-11-19 NOTE — Patient Instructions (Signed)
https://www.cancer.org/cancer/colon-rectal-cancer/causes-risks-prevention/risk-factors.html">  Colorectal Cancer Screening  Colorectal cancer screening is a group of tests that are used to check for colorectal cancer before symptoms develop. Colorectal refers to the colon and rectum. The colon and rectum are located at the end of the digestive tract and carry stool (feces) out of the body. Who should have screening? All adults who are 45-49 years old should have screening. Your health care provider may recommend screening before age 45. You will have tests every 1-10 years, depending on your results and the type of screening test. Screening recommendations for adults who are 76-85 years old vary depending on a person's health. People older than age 85 should no longer get colorectal cancerscreening. You may have screening tests starting before age 45, or more often than other people, if you have any of these risk factors: A personal or family history of colorectal cancer or abnormal growths known as polyps in your colon. Inflammatory bowel disease, such as ulcerative colitis or Crohn's disease. A history of having radiation treatment to the abdomen or the area between the hip bones (pelvic area) for cancer. A type of genetic syndrome that is passed from parent to child (hereditary), such as: Lynch syndrome. Familial adenomatous polyposis. Turcot syndrome. Peutz-Jeghers syndrome. MUTYH-associated polyposis (MAP). A personal history of diabetes. Types of tests There are several types of colorectal screening tests. You may have one or more of the following: Guaiac-based fecal occult blood testing. For this test, a stool sample is checked for hidden (occult) blood, which could be a sign of colorectal cancer. Fecal immunochemical test (FIT). For this test, a stool sample is checked for blood, which could be a sign of colorectal cancer. Stool DNA test. For this test, a stool sample is checked for  blood and changes in DNA that could lead to colorectal cancer. Sigmoidoscopy. During this test, a thin, flexible tube with a camera on the end, called a sigmoidoscope, is used to examine the rectum and the lower colon. Colonoscopy. During this test, a long, flexible tube with a camera on the end, called a colonoscope, is used to examine the entire colon and rectum. Also, sometimes a tissue sample is taken to be looked at under a microscope (biopsy) or small polyps are removed during this test. Virtual colonoscopy. Instead of a colonoscope, this type of colonoscopy uses a CT scan to take pictures of the colon and rectum. A CT scan is a type of X-ray that is made using computers. What are the benefits of screening? Screening reduces your risk for colorectal cancer and can help identify cancer at an early stage, when the cancer can be removed or treated more easily. It is common for polyps to form in the lining of the colon, especially as you age. These polyps may be cancerous or become cancerous over time. Screening canidentify these polyps. What are the risks of screening? Generally, these are safe tests. However, problems may occur, including: The need for more tests to confirm results from a stool sample test. Stool sample tests have fewer risks than other types of screening tests. Being exposed to low levels of radiation, if you had a test involving X-rays. This may slightly increase your cancer risk. The benefit of detecting cancer outweighs the slight increase in risk. Bleeding, damage to the intestine, or infection caused by a sigmoidoscopy or colonoscopy. A reaction to medicines given during a sigmoidoscopy or colonoscopy. Talk with your health care provider to understand your risk for colorectalcancer and to make a screening   plan that is right for you. Questions to ask your health care provider When should I start colorectal cancer screening? What is my risk for colorectal cancer? How often do  I need screening? Which screening tests do I need? How do I get my test results? What do my results mean? Where to find more information Learn more about colorectal cancer screening from: The American Cancer Society: cancer.org National Cancer Institute: cancer.gov Summary Colorectal cancer screening is a group of tests used to check for colorectal cancer before symptoms develop. All adults who are 29-8 years old should have screening. Your health care provider may recommend screening before age 47. You may have screening tests starting before age 84, or more often than other people, if you have certain risk factors. Screening reduces your risk for colorectal cancer and can help identify cancer at an early stage, when the cancer can be removed or treated more easily. Talk with your health care provider to understand your risk for colorectal cancer and to make a screening plan that is right for you. This information is not intended to replace advice given to you by your health care provider. Make sure you discuss any questions you have with your healthcare provider. Document Revised: 07/05/2019 Document Reviewed: 07/05/2019 Elsevier Patient Education  2022 [employer].

## 2020-11-20 LAB — CMP14+EGFR
ALT: 10 IU/L (ref 0–32)
AST: 13 IU/L (ref 0–40)
Albumin/Globulin Ratio: 2 (ref 1.2–2.2)
Albumin: 4.4 g/dL (ref 3.8–4.8)
Alkaline Phosphatase: 54 IU/L (ref 44–121)
BUN/Creatinine Ratio: 8 — ABNORMAL LOW (ref 9–23)
BUN: 7 mg/dL (ref 6–24)
Bilirubin Total: 0.3 mg/dL (ref 0.0–1.2)
CO2: 21 mmol/L (ref 20–29)
Calcium: 10.7 mg/dL — ABNORMAL HIGH (ref 8.7–10.2)
Chloride: 103 mmol/L (ref 96–106)
Creatinine, Ser: 0.86 mg/dL (ref 0.57–1.00)
Globulin, Total: 2.2 g/dL (ref 1.5–4.5)
Glucose: 88 mg/dL (ref 65–99)
Potassium: 4.6 mmol/L (ref 3.5–5.2)
Sodium: 139 mmol/L (ref 134–144)
Total Protein: 6.6 g/dL (ref 6.0–8.5)
eGFR: 83 mL/min/{1.73_m2} (ref >59)

## 2020-11-20 LAB — CBC WITH DIFFERENTIAL/PLATELET
Basophils Absolute: 0.1 10*3/uL (ref 0.0–0.2)
Basos: 1 %
EOS (ABSOLUTE): 0.2 10*3/uL (ref 0.0–0.4)
Eos: 4 %
Hematocrit: 39.7 % (ref 34.0–46.6)
Hemoglobin: 12.8 g/dL (ref 11.1–15.9)
Immature Grans (Abs): 0 10*3/uL (ref 0.0–0.1)
Immature Granulocytes: 0 %
Lymphocytes Absolute: 2.1 10*3/uL (ref 0.7–3.1)
Lymphs: 35 %
MCH: 27.8 pg (ref 26.6–33.0)
MCHC: 32.2 g/dL (ref 31.5–35.7)
MCV: 86 fL (ref 79–97)
Monocytes Absolute: 0.6 10*3/uL (ref 0.1–0.9)
Monocytes: 11 %
Neutrophils Absolute: 3 10*3/uL (ref 1.4–7.0)
Neutrophils: 49 %
Platelets: 392 10*3/uL (ref 150–450)
RBC: 4.61 x10E6/uL (ref 3.77–5.28)
RDW: 14 % (ref 11.7–15.4)
WBC: 6 10*3/uL (ref 3.4–10.8)

## 2020-11-20 LAB — LIPID PANEL
Chol/HDL Ratio: 3.4 ratio (ref 0.0–4.4)
Cholesterol, Total: 197 mg/dL (ref 100–199)
HDL: 58 mg/dL (ref >39)
LDL Chol Calc (NIH): 129 mg/dL — ABNORMAL HIGH (ref 0–99)
Triglycerides: 53 mg/dL (ref 0–149)
VLDL Cholesterol Cal: 10 mg/dL (ref 5–40)

## 2020-11-20 LAB — TSH+FREE T4
Free T4: 1.34 ng/dL (ref 0.82–1.77)
TSH: 0.807 u[IU]/mL (ref 0.450–4.500)

## 2020-11-22 ENCOUNTER — Telehealth (INDEPENDENT_AMBULATORY_CARE_PROVIDER_SITE_OTHER): Payer: Self-pay

## 2020-11-22 NOTE — Telephone Encounter (Signed)
Per DPR left voicemail notifying patient that labs were normal. Dieter Hane Budd Palmer, CMA

## 2020-11-22 NOTE — Telephone Encounter (Signed)
-----   Message from Grayce Sessions, NP sent at 11/21/2020  4:47 PM EDT ----- Labs are normal. Make sure you are drinking at least 48 oz of water per day. Work on eating a low fat, heart healthy diet and participate in regular aerobic exercise program to control as well. Exercise at least  30 minutes per day-5 days per week. Avoid red meat. No fried foods. No junk foods, sodas, sugary foods or drinks, unhealthy snacking, alcohol or smoking.

## 2022-04-07 IMAGING — MG DIGITAL SCREENING BILAT W/ CAD
4 series · 4 of 4 positions shown · non-contrast
Comparison: Previous exam(s).

CLINICAL DATA: Screening.

EXAM:
DIGITAL SCREENING BILATERAL MAMMOGRAM WITH CAD

[L MLO]
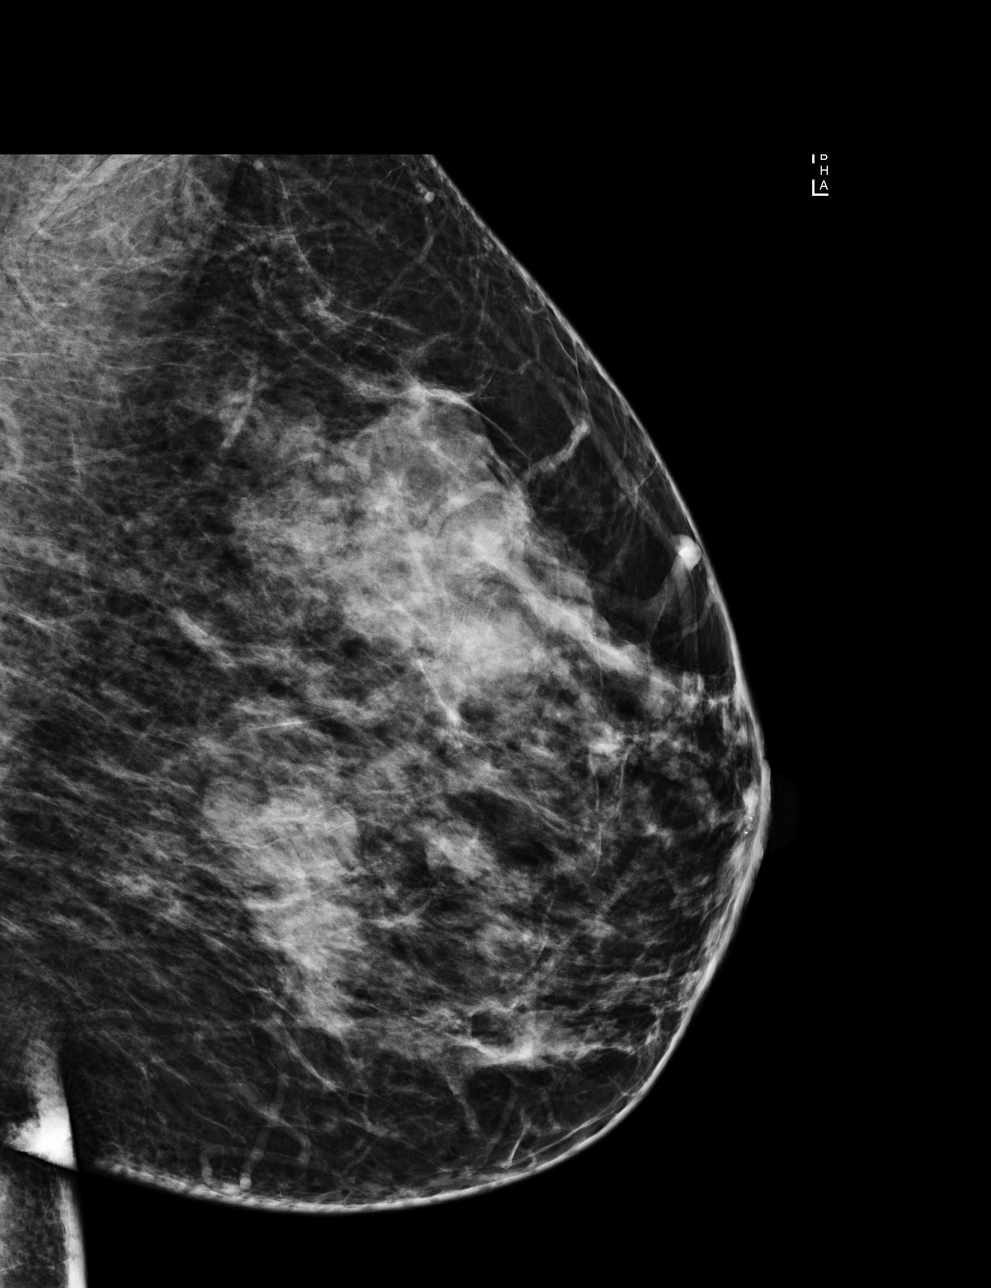

[R CC]
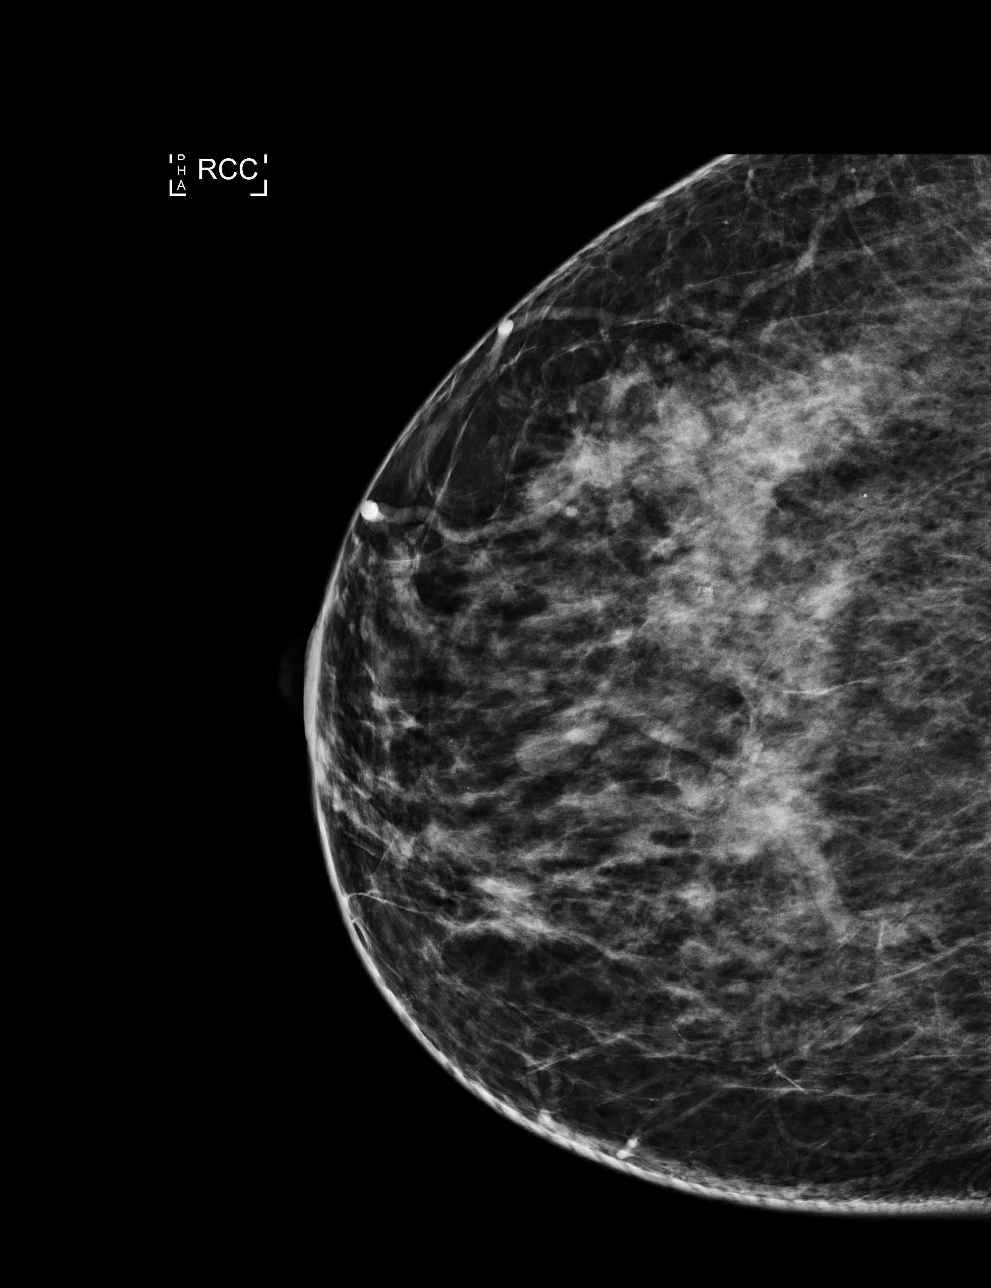

[L CC]
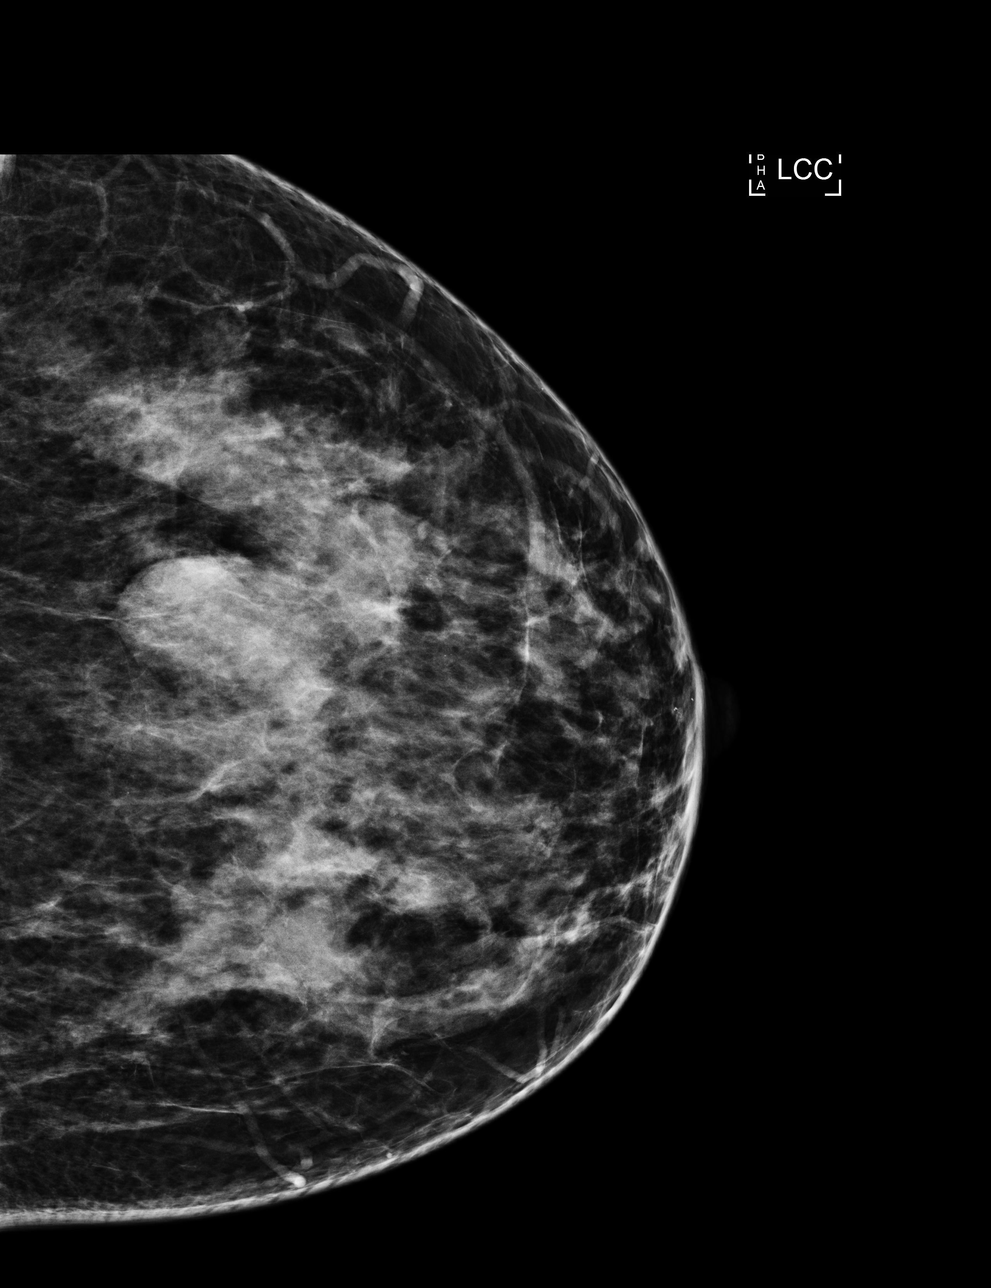

[R MLO]
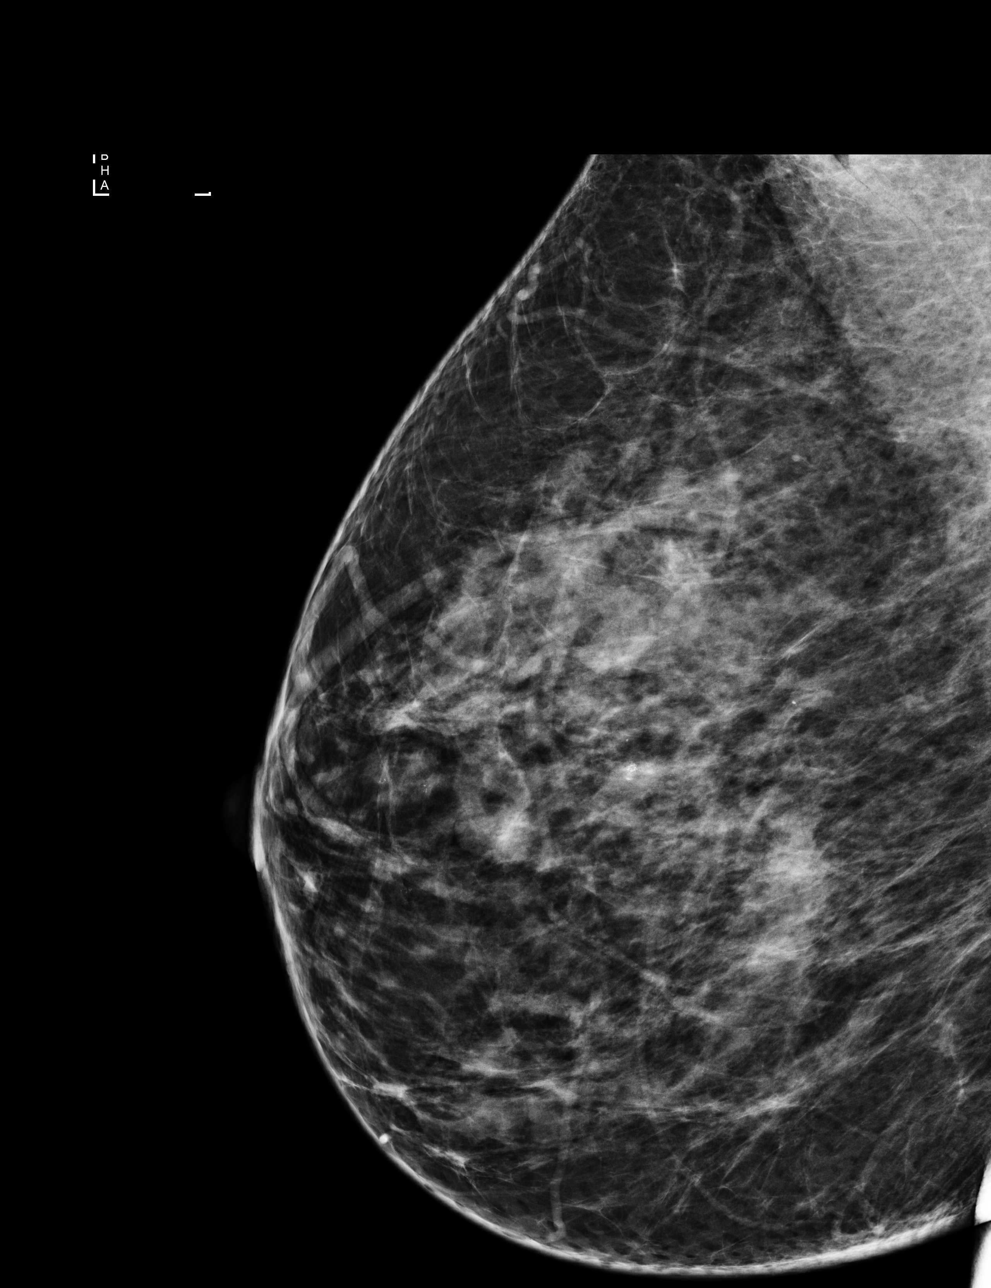

[4 of 4 positions shown; findings below may reference images not displayed]

ACR Breast Density Category c: The breast tissue is heterogeneously
dense, which may obscure small masses.
FINDINGS: There are no findings suspicious for malignancy. Images were
processed with CAD.
IMPRESSION: No mammographic evidence of malignancy. A result letter of this
screening mammogram will be mailed directly to the patient.

RECOMMENDATION:
Screening mammogram in one year. (Code:[id number])

BI-RADS CATEGORY  1: Negative.

## 2023-02-23 DIAGNOSIS — Z124 Encounter for screening for malignant neoplasm of cervix: Secondary | ICD-10-CM | POA: Diagnosis not present

## 2023-03-02 DIAGNOSIS — F4320 Adjustment disorder, unspecified: Secondary | ICD-10-CM | POA: Diagnosis not present

## 2023-03-09 DIAGNOSIS — Z1231 Encounter for screening mammogram for malignant neoplasm of breast: Secondary | ICD-10-CM | POA: Diagnosis not present

## 2023-03-09 DIAGNOSIS — G8929 Other chronic pain: Secondary | ICD-10-CM | POA: Diagnosis not present

## 2023-03-09 DIAGNOSIS — M25511 Pain in right shoulder: Secondary | ICD-10-CM | POA: Diagnosis not present

## 2023-03-09 DIAGNOSIS — R92333 Mammographic heterogeneous density, bilateral breasts: Secondary | ICD-10-CM | POA: Diagnosis not present

## 2023-03-09 DIAGNOSIS — M25512 Pain in left shoulder: Secondary | ICD-10-CM | POA: Diagnosis not present

## 2023-03-19 DIAGNOSIS — Z1211 Encounter for screening for malignant neoplasm of colon: Secondary | ICD-10-CM | POA: Diagnosis not present
